# Patient Record
Sex: Male | Born: 1945 | Race: White | Hispanic: No | Marital: Married | Smoking: Never smoker
Health system: Southern US, Community
[De-identification: ages and names within clinical notes are randomized; demographics above are authoritative.]

---

## 2003-02-24 ENCOUNTER — Encounter: Payer: Self-pay | Admitting: Cardiology

## 2003-02-24 ENCOUNTER — Observation Stay (HOSPITAL_COMMUNITY): Admission: AD | Admit: 2003-02-24 | Discharge: 2003-02-24 | Payer: Self-pay | Admitting: Cardiology

## 2003-02-27 ENCOUNTER — Ambulatory Visit (HOSPITAL_COMMUNITY): Admission: RE | Admit: 2003-02-27 | Discharge: 2003-02-28 | Payer: Self-pay | Admitting: Cardiology

## 2005-11-10 ENCOUNTER — Ambulatory Visit: Payer: Self-pay | Admitting: Oncology

## 2009-05-14 ENCOUNTER — Ambulatory Visit (HOSPITAL_COMMUNITY): Admission: RE | Admit: 2009-05-14 | Discharge: 2009-05-14 | Payer: Self-pay | Admitting: Family Medicine

## 2009-05-24 ENCOUNTER — Ambulatory Visit: Payer: Self-pay | Admitting: Emergency Medicine

## 2009-05-24 DIAGNOSIS — J984 Other disorders of lung: Secondary | ICD-10-CM | POA: Insufficient documentation

## 2009-05-24 DIAGNOSIS — I10 Essential (primary) hypertension: Secondary | ICD-10-CM

## 2009-05-24 DIAGNOSIS — K219 Gastro-esophageal reflux disease without esophagitis: Secondary | ICD-10-CM

## 2009-05-24 DIAGNOSIS — I4891 Unspecified atrial fibrillation: Secondary | ICD-10-CM

## 2009-05-24 DIAGNOSIS — L930 Discoid lupus erythematosus: Secondary | ICD-10-CM | POA: Insufficient documentation

## 2009-05-28 ENCOUNTER — Ambulatory Visit (HOSPITAL_COMMUNITY): Admission: RE | Admit: 2009-05-28 | Discharge: 2009-05-28 | Payer: Self-pay | Admitting: Emergency Medicine

## 2009-05-31 ENCOUNTER — Ambulatory Visit: Payer: Self-pay | Admitting: Thoracic Surgery

## 2009-05-31 ENCOUNTER — Ambulatory Visit (HOSPITAL_COMMUNITY): Admission: RE | Admit: 2009-05-31 | Discharge: 2009-05-31 | Payer: Self-pay | Admitting: Thoracic Surgery

## 2009-05-31 ENCOUNTER — Encounter: Payer: Self-pay | Admitting: Thoracic Surgery

## 2009-05-31 ENCOUNTER — Ambulatory Visit: Payer: Self-pay | Admitting: Emergency Medicine

## 2009-06-14 ENCOUNTER — Ambulatory Visit (HOSPITAL_COMMUNITY): Admission: RE | Admit: 2009-06-14 | Discharge: 2009-06-14 | Payer: Self-pay | Admitting: Emergency Medicine

## 2009-06-19 ENCOUNTER — Ambulatory Visit: Payer: Self-pay | Admitting: Emergency Medicine

## 2009-07-02 ENCOUNTER — Telehealth: Payer: Self-pay | Admitting: Emergency Medicine

## 2009-07-03 ENCOUNTER — Encounter: Payer: Self-pay | Admitting: Emergency Medicine

## 2009-07-03 ENCOUNTER — Telehealth (INDEPENDENT_AMBULATORY_CARE_PROVIDER_SITE_OTHER): Payer: Self-pay | Admitting: *Deleted

## 2009-12-05 ENCOUNTER — Ambulatory Visit: Payer: Self-pay | Admitting: Emergency Medicine

## 2009-12-05 LAB — CONVERTED CEMR LAB
BUN: 19 mg/dL (ref 6–23)
CO2: 25 meq/L (ref 19–32)
Calcium: 8.8 mg/dL (ref 8.4–10.5)
Creatinine, Ser: 1.2 mg/dL (ref 0.4–1.5)
Glucose, Bld: 97 mg/dL (ref 70–99)

## 2009-12-12 ENCOUNTER — Ambulatory Visit: Payer: Self-pay | Admitting: Cardiology

## 2010-10-23 IMAGING — CT CT CHEST W/ CM
2 of 4 series · 15 of 36 positions shown, 18 images · IV contrast (Omnipaque 300)
Comparison: 06/15/2009

CLINICAL DATA: Follow-up pulmonary nodule.

CT CHEST WITH CONTRAST
TECHNIQUE: Multidetector CT imaging of the chest was performed
following the standard protocol during bolus administration of
intravenous contrast.
Contrast: 80 ml 3mnipaque-WQQ

[Series 2: chest routine with · axial · 0.73mm/px · z∈[-118,+176]mm · 12 of 71 slices shown, 15 images]
[im 6/71  mediastinal]
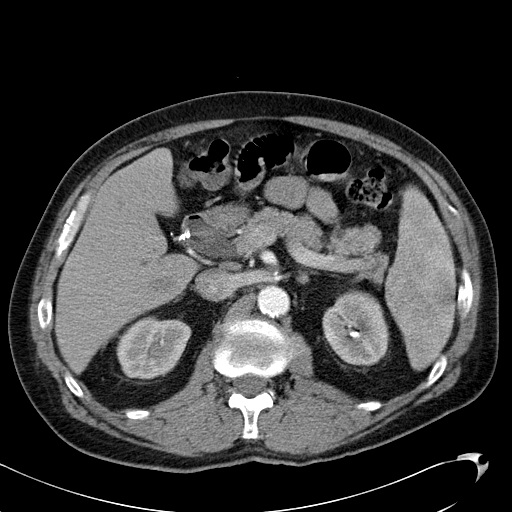
[im 6/71  lung]
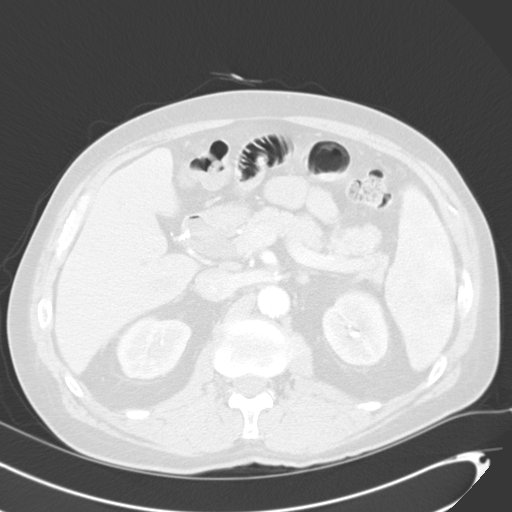
[im 11/71  lung]
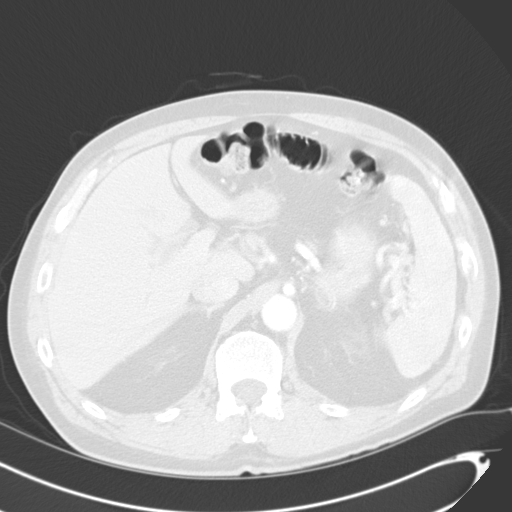
[im 17/71  lung]
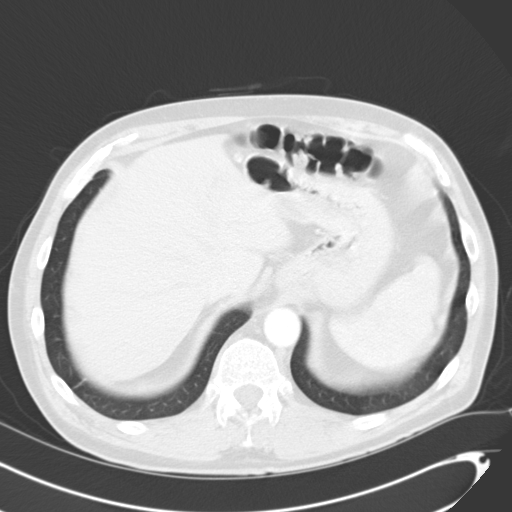
[im 22/71  lung]
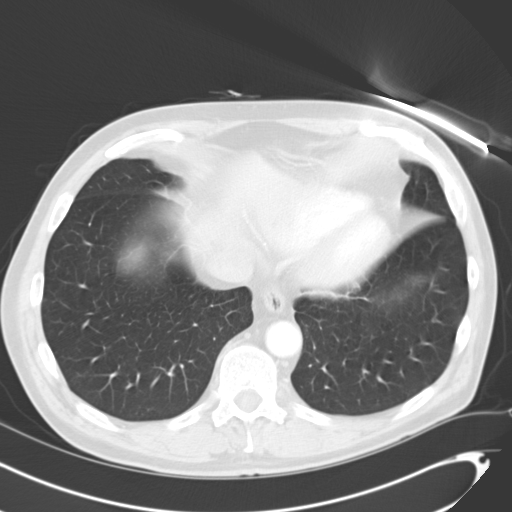
[im 27/71  mediastinal]
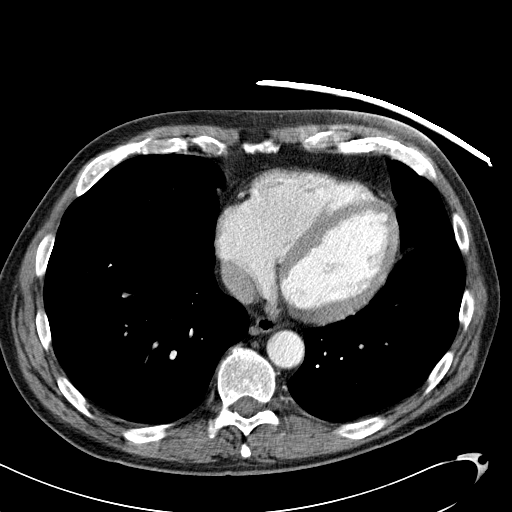
[im 27/71  lung]
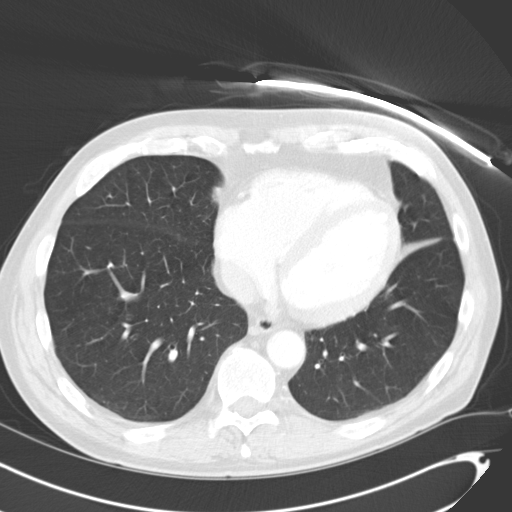
[im 33/71  lung]
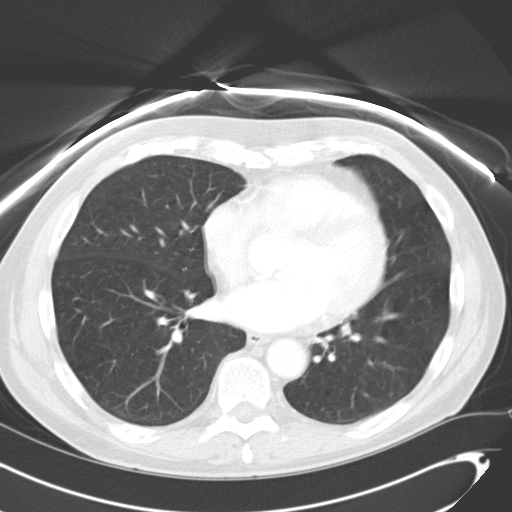
[im 38/71  lung]
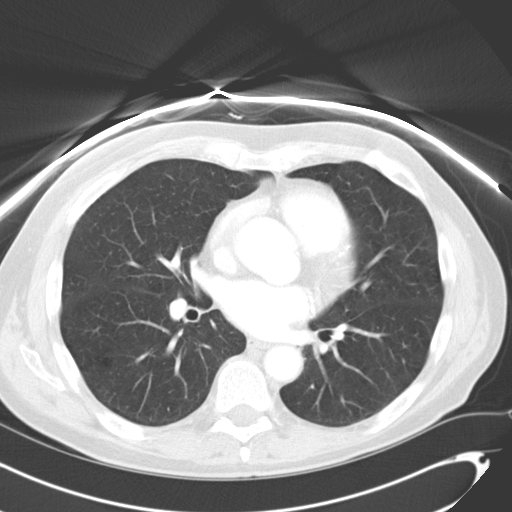
[im 44/71  lung]
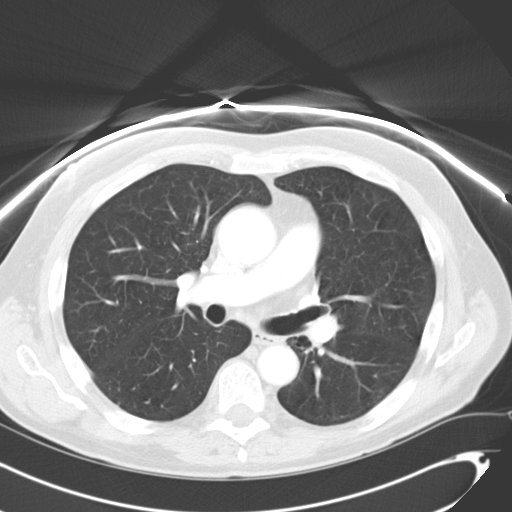
[im 49/71  mediastinal]
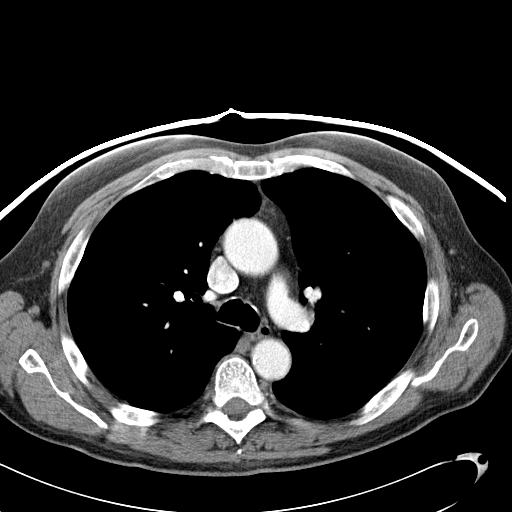
[im 49/71  lung]
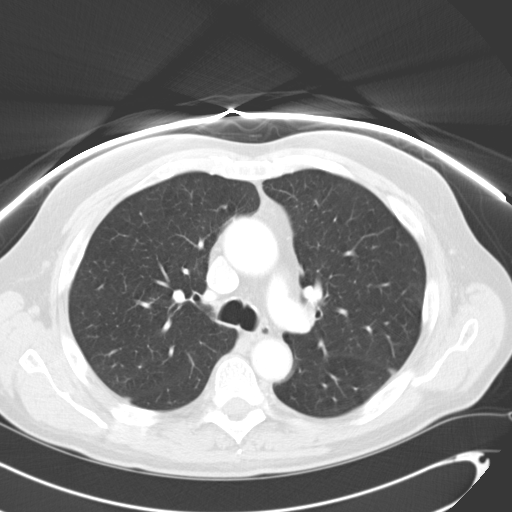
[im 54/71  lung]
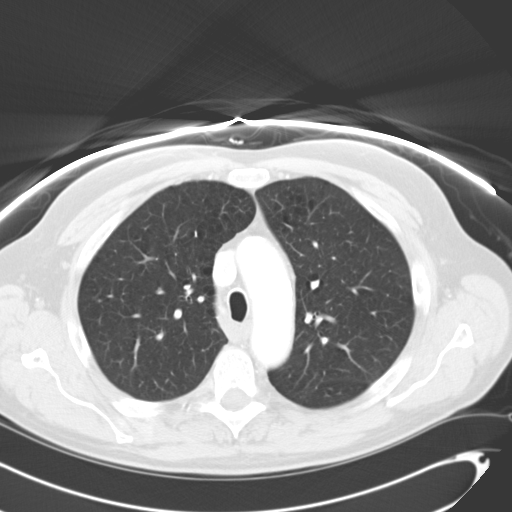
[im 60/71  lung]
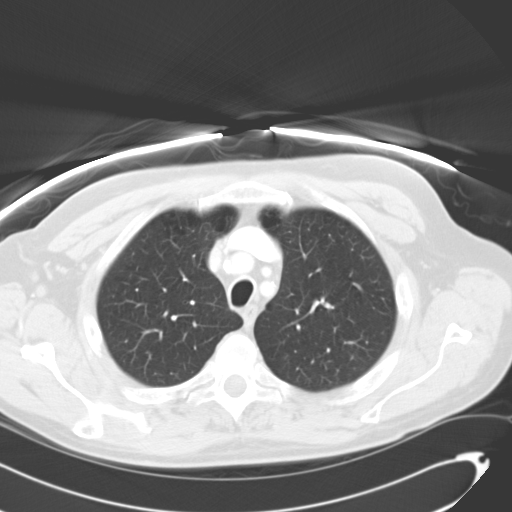
[im 65/71  lung]
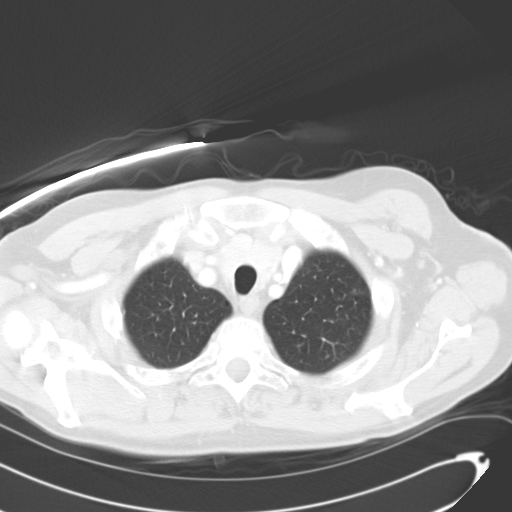

[Series 602: <mpr range> · coronal · 0.73mm/px · 3 of 116 slices shown]
[im 24/116  lung]
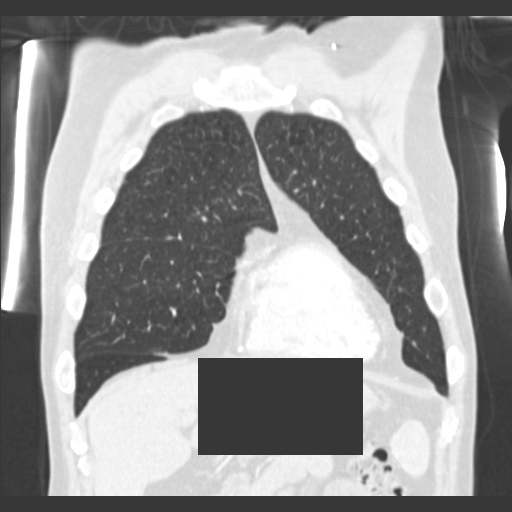
[im 47/116  lung]
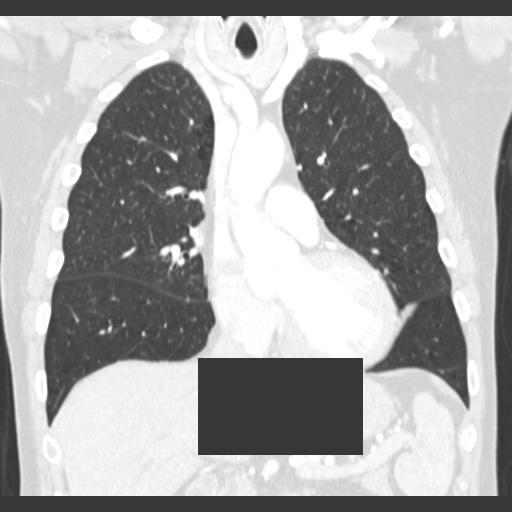
[im 70/116  lung]
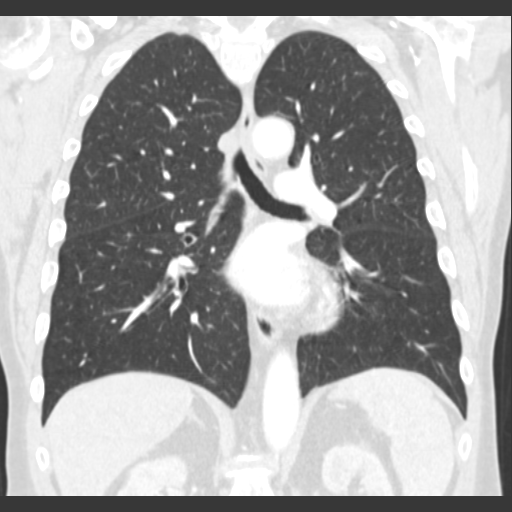

[15 of 36 positions shown; findings below may reference images not displayed]

FINDINGS: Low attenuation lesions in the thyroid measure up to 12
mm on the left.  There is atherosclerotic calcification of the
arterial vasculature, including coronary arteries.  Ascending aorta
measures up to 4.3 cm, stable.  No pathologically enlarged
mediastinal, hilar or axillary lymph nodes.  Heart size normal.  No
pericardial effusion.  Small hiatal hernia.

There has been interval decrease in size of a nodular density in
the left upper lobe (image #8).  It is now linear in configuration.
Emphysema.  Other scattered tiny nodules are stable in size.  No
new nodules.  No pleural fluid.  Airway is unremarkable.

Incidental imaging of the upper abdomen shows low attenuation
lesions in the liver measuring up to 12 mm.  There are stones in
the kidneys bilaterally.  No worrisome lytic or sclerotic lesions.
IMPRESSION: 1.  Near complete resolution of a left upper lobe nodular density,
with probable residual linear scarring.
2.  Mildly dilated ascending aorta.
3.  Extensive coronary artery calcification.
4.  Bilateral nephrolithiasis.

## 2010-10-30 LAB — COMPREHENSIVE METABOLIC PANEL WITH GFR
ALT: 25 U/L (ref 0–53)
AST: 25 U/L (ref 0–37)
Albumin: 3.7 g/dL (ref 3.5–5.2)
Alkaline Phosphatase: 78 U/L (ref 39–117)
BUN: 14 mg/dL (ref 6–23)
CO2: 26 meq/L (ref 19–32)
Calcium: 9 mg/dL (ref 8.4–10.5)
Chloride: 106 meq/L (ref 96–112)
Creatinine, Ser: 1.3 mg/dL (ref 0.4–1.5)
GFR calc non Af Amer: 56 mL/min — ABNORMAL LOW
Glucose, Bld: 87 mg/dL (ref 70–99)
Potassium: 4.2 meq/L (ref 3.5–5.1)
Sodium: 139 meq/L (ref 135–145)
Total Bilirubin: 0.8 mg/dL (ref 0.3–1.2)
Total Protein: 6.1 g/dL (ref 6.0–8.3)

## 2010-10-30 LAB — CBC
HCT: 28.4 % — ABNORMAL LOW (ref 39.0–52.0)
HCT: 32.7 % — ABNORMAL LOW (ref 39.0–52.0)
Hemoglobin: 9.8 g/dL — ABNORMAL LOW (ref 13.0–17.0)
Hemoglobin: 11.2 g/dL — ABNORMAL LOW (ref 13.0–17.0)
MCHC: 34.4 g/dL (ref 30.0–36.0)
MCHC: 34.2 g/dL (ref 30.0–36.0)
MCV: 98.7 fL (ref 78.0–100.0)
MCV: 99.2 fL (ref 78.0–100.0)
Platelets: 160 K/uL (ref 150–400)
Platelets: 155 K/uL (ref 150–400)
RBC: 2.88 MIL/uL — ABNORMAL LOW (ref 4.22–5.81)
RBC: 3.3 MIL/uL — ABNORMAL LOW (ref 4.22–5.81)
RDW: 17 % — ABNORMAL HIGH (ref 11.5–15.5)
RDW: 17.1 % — ABNORMAL HIGH (ref 11.5–15.5)
WBC: 4 K/uL (ref 4.0–10.5)
WBC: 6.2 K/uL (ref 4.0–10.5)

## 2010-10-30 LAB — APTT
aPTT: 28 s (ref 24–37)
aPTT: 29 s (ref 24–37)

## 2010-10-30 LAB — FUNGUS CULTURE W SMEAR

## 2010-10-30 LAB — AFB CULTURE WITH SMEAR (NOT AT ARMC): Acid Fast Smear: NONE SEEN

## 2010-10-30 LAB — PROTIME-INR
INR: 1.16 (ref 0.00–1.49)
Prothrombin Time: 14.7 s (ref 11.6–15.2)

## 2010-10-30 LAB — CULTURE, RESPIRATORY W GRAM STAIN

## 2010-10-31 LAB — GLUCOSE, CAPILLARY: Glucose-Capillary: 79 mg/dL (ref 70–99)

## 2010-12-13 NOTE — H&P (Signed)
Jeffrey, [name]                          ACCOUNT NO.:  [id number]   MEDICAL RECORD NO.:  [id number]                   PATIENT TYPE:  INP   LOCATION:  2027                                 FACILITY:  [facility]   PHYSICIAN:  Learta Codding, M.D.                 DATE OF BIRTH:  [birth year]   DATE OF ADMISSION:  02/24/2003  DATE OF DISCHARGE:                                HISTORY & PHYSICAL   HISTORY OF PRESENT ILLNESS:  A 65 year old gentleman admitted to [facility] with chest pain yesterday.  Jeffrey [name] has no history of known  cardiovascular disease.  He has had borderline hypertension that has not  been treated medically.  He is uncertain regarding lipid status.  He  experienced approximately eight hours of circumferential chest aching on  Wednesday.  This persisted while he was at work.  He left early and went  home and went to sleep after taking some aspirin.  When he awoke, he was  symptom-free and has remained so.  He sought evaluation by his primary care  doctor the next day who admitted him to [facility].  After cardiology  consultation, he was referred here for coronary angiography.  His chest  discomfort was moderately severe.  There was associated diaphoresis and  nausea, but no dyspnea.  He has no history of GI problems.  He has never had  similar symptoms.   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  His only     other medication is hydroxychloroquine.  2. He has had right shoulder surgery in the past for orthopaedic problems.   SOCIAL HISTORY:  Married and lives in [place] with four children.  Smokes  five cigars per day and has done so for the past 10 years.  Occasionally,  uses alcohol.  Works as a Hydrologist.   FAMILY HISTORY:  Father suffered a myocardial infarction at age 71;  he has  had grandparents with myocardial infarction as well.   REVIEW OF SYSTEMS:  Occasional diaphoresis;  lupus symptoms  restricted to  his skin;  frequent eruptation; all other systems negative.   PHYSICAL EXAMINATION:  GENERAL:  A pleasant gentleman in no acute distress.  VITAL SIGNS:  Blood pressure 145/80, heart rate 50 and regular, respirations  16, temperature 98.5.  HEENT:  Anicteric sclerae.  NECK:  Erythema of the malar prominences.  No jugular venous distention.  No  carotid bruits.  ABDOMEN:  Soft and nontender.  No organomegaly.  CARDIAC:  Normal first and second heart sounds;  fourth heart sound present.  LUNGS:  Clear.  EXTREMITIES:  Distal pulses intact;  no edema.  NEUROMUSCULAR:  Symmetric strength and tone.   LABORATORY DATA:  Chest x-ray:  NAD.   EKG:  Sinus bradycardia;  minor nonspecific ST segment abnormality.   Cardiac markers negative x3.  IMPRESSION:  Jeffrey [name] presents with a single episode of chest discomfort  that certainly could represent myocardial ischemia.  EKG's and cardiac  markers have been negative.  He can not spend the weekend in the hospital if  possible.  I believe that this can be accomplished if he has a negative  echocardiogram, negative CT scan, and negative stress test.  We will attempt  to obtain these studies today.  If results are acceptable, he will be  discharged with aspirin, low-dose beta blocker, and sublingual nitroglycerin  to return for outpatient coronary angiography on Monday.  If he experiences  additional chest discomfort, he is instructed to call 911 immediately after  taking sublingual nitroglycerin whether or not that medication achieves  relief of his symptoms.       Calcium Bing, M.D.                     Learta Codding, M.D.    RR/MEDQ  D:  02/24/2003  T:  02/24/2003  Job:  [id number]

## 2010-12-13 NOTE — Cardiovascular Report (Signed)
NAME:  Jeffrey [name], Jeffrey [name]                          ACCOUNT NO.:  [id number]   MEDICAL RECORD NO.:  [id number]                   PATIENT TYPE:  OIB   LOCATION:  2869                                 FACILITY:  [facility]   PHYSICIAN:  Arturo Morton. Riley Kill, M.D.             DATE OF BIRTH:  03/23/1946   DATE OF PROCEDURE:  02/27/2003  DATE OF DISCHARGE:                              CARDIAC CATHETERIZATION   INDICATIONS:  [name] is a patient seen by Aundra Dubin. Revankar, M.D. and  referred and evaluated by Speedway Bing, M.D.  The patient is a 65-year-  old gentleman who has had some chest pain.  He underwent CT scanning which  did not reveal pulmonary emboli.  He also had a stress test in which he had  good exercise tolerance, exercised for nearly 11 minutes and had no chest  pain.  There were some mild ST segment changes.  Subsequently, the patient  was referred for cardiac catheterization.  The current study was done to  assess coronary anatomy.  His echocardiogram apparently was unremarkable as  well.  He was discharged and brought back for cardiac catheterization.   PROCEDURE:  1. Left heart catheterization.  2. Selective coronary arteriography.  3. Selective left ventriculography.   DESCRIPTION OF PROCEDURE:  The procedure was performed from the femoral  artery using 6-French catheters.  He tolerated the procedure well and there  were no complications.   HEMODYNAMIC DATA:  1. Central aorta 148/81, mean 107.  2. Left ventricle 140/11.  3. No gradient on pullback across the aortic valve.   ANGIOGRAPHIC DATA:  1. Ventriculography was performed in the RAO projection.  Overall systolic     function was well preserved and no segmental abnormalities or contraction     were identified.  Ejection fraction appeared to be in excess of 55-60%.  2. The left main coronary artery was free of critical disease.  3. The LAD coursed to the apex.  Between the origins of the first and second     diagonal  branch is an area of slight hypodensity noted in some of the RAO     views, although there did not appear to be more than 30% luminal     reduction.  In the RAO cranial and LAO cranial views the vessel appeared     to be widely patent.  Additional views were obtained also and did not     reveal high grade disease.  The distal LAD appeared to be free of     critical disease.  4. There is a ramus intermedius moderate sized which is free of critical     disease.  5. The AV circumflex demonstrates about 30% eccentric plaquing, however, no     high grade areas of narrowing were noted.  6. The right coronary artery provides a large posterior descending and tiny     posterolateral branch.  The  right coronary artery has a mid 30% area of     stenosis.  It does not appear, likewise, to be high grade.   CONCLUSIONS:  1. Preserved overall left ventricular function.  2. 30% area of irregularity with some hypodensity in the mid left anterior     descending, question healing ruptured plaque versus just mild     irregularity due to the tortuosity at the location site.   DISPOSITION:  We would recommend that the patient stop smoking.  I likely  will add Plavix, but would not keep him on it Darling-term as he is currently  on prednisone for discoid lupus.  We would clearly recommend discontinuation  smoking and the decision regarding possibly lipid reduction.  Follow-up will  be with Aundra Dubin. Revankar, M.D.                                               Arturo Morton. Riley Kill, M.D.    TDS/MEDQ  D:  02/27/2003  T:  02/27/2003  Job:  [id number]   cc:   CV Lab   Aundra Dubin. Revankar, M.D.  [address]  [place]  [place] [address]  Fax: [phone]    Bing, M.D.

## 2010-12-13 NOTE — Discharge Summary (Signed)
NAMESHERYL, TOWELL                          ACCOUNT NO.:  [id number]   MEDICAL RECORD NO.:  [id number]                   PATIENT TYPE:  OIB   LOCATION:  2031                                 FACILITY:  [facility]   PHYSICIAN:  Learta Codding, M.D.                 DATE OF BIRTH:  11/09/1945   DATE OF ADMISSION:  02/27/2003  DATE OF DISCHARGE:  02/28/2003                                 DISCHARGE SUMMARY   DISCHARGE DIAGNOSES:  1. Mild nonobstructive coronary artery disease.  2. Hypertension.  3. Discoid lupus.  4. History of  right shoulder surgery.   Cardiac catheterization this admission by Arturo Morton. Riley Kill, M.D.  EF 55-  60%.  Left main free of critical disease.  Mid-LAD with 30% area of  irregularity with some hypodensity, question healing ruptured plaque versus  just mild irregularity due to tortuosity at the plication site.  Ramus  intermedius moderate-sized, free of critical disease.  AV circumflex 30%  eccentric plaquing.  RCA mid-30% stenosis.   HOSPITAL COURSE:  Please see the admission history and physical from Speed Bing, M.D., on February 24, 2003.  Briefly, this 65 year old gentleman had  been admitted to [facility] with chest pain.  He was referred to  [facility] for further evaluation by Aundra Dubin. Revankar, M.D.  He  underwent CT scanning, which did not reveal pulmonary emboli.  It was  decided that the patient could be discharged and brought back for an  elective catheterization.  He returned to [facility] on February 27, 2003.  He underwent the cardiac catheterization noted above by Dr. Riley Kill.  He tolerated the procedure well and had no immediate complications.  Dr.  Riley Kill recommended that the patient discontinue smoking.  He also  recommended that he remain on aspirin and add Plavix to his medical regimen  for at least two to three months.  On the morning of February 28, 2003, he was  found to be in stable condition by Dr. Myrtis Ser.  He was  ready for discharge to  home and would need follow-up in [place] with Dr. Tomie China.   LABORATORY DATA:  White count 3900, hemoglobin 14.4, hematocrit 41, platelet  count 129,000.  Sodium 141, potassium 3.9, chloride 110, CO2 26, glucose  118, BUN 11, creatinine 0.9, calcium 8.8.  Chest CT from July 30:  Borderline aortic root size measuring 4 cm.  No evidence of pulmonary  embolus or aortic dissection.   DISCHARGE MEDICATIONS:  1. Coated aspirin 325 mg daily.  2. Plavix 75 mg daily.  3. Lopressor 50 mg half-tablet twice daily.  4. Prednisone.  5. Plaquenil 200 mg twice daily.  6. Nitroglycerin as needed for chest pain.   PAIN MANAGEMENT:  Tylenol as needed.  Nitroglycerin as needed for chest  pain.  He is to call 911 for recurrent chest pain.   ACTIVITY:  No  driving, heavy lifting, exertion, work, or sex for three days.   DIET:  Low-salt, low-fat.   WOUND CARE:  The patient is to call our office in [facility] for any groin  swelling, bleeding, or bruising.   SPECIAL INSTRUCTIONS:  He is to have his cholesterol panel and LFTs checked  with Dr. Tomie China at his follow-up appointment.   FOLLOW-UP:  With Dr. Tomie China in the next one or two weeks.  He should call  for an appointment.  He should follow up with family practice as needed.      Tereso Newcomer, P.A.                        Learta Codding, M.D.    SW/MEDQ  D:  02/28/2003  T:  02/28/2003  Job:  [id number]   cc:   Aundra Dubin. Revankar, M.D.  [address]  [place]  [place] [address]  Fax: [phone]

## 2010-12-13 NOTE — Discharge Summary (Signed)
   Jeffrey [name], Jeffrey [name]                          ACCOUNT NO.:  [id number]   MEDICAL RECORD NO.:  [id number]                   PATIENT TYPE:  INP   LOCATION:  2027                                 FACILITY:  [facility]   PHYSICIAN:  Inwood Bing, M.D.               DATE OF BIRTH:  1946-06-03   DATE OF ADMISSION:  02/24/2003  DATE OF DISCHARGE:                                 DISCHARGE SUMMARY   ADDENDUM:   FINAL PROBLEM:  Family history of coronary artery disease.   PLAN:  As noted, the patient had multiple studies today in attempts to rule  out coronary artery disease.  His treadmill test was mildly abnormal.  Plan  is for the patient to be discharged today to return Monday for an outpatient  cardiac catheterization to further evaluate his symptoms.   DISCHARGE MEDICATIONS:  1. Lopressor 25 mg b.i.d.  2. Coated aspirin 325 mg daily.  3. Nitroglycerin p.r.n. chest pain.  4. Prednisone 20 mg daily.  5. Plaquenil 200 mg b.i.d.  6. Tylenol p.r.n. for pain.   DISCHARGE INSTRUCTIONS:  The patient was told to avoid any strenuous  activity until he returns for his catheterization.  He is to be on a low  salt, low fat diet.  He was told not to eat or drink anything after midnight  Sunday night.  He could take his medications Monday a.m. with small sips of  water.  He is to return to [facility] Monday, February 27, 2003 at  9:30 a.m. for cardiac catheterization.      Delton See, P.A. [facility]                  Eleva Bing, M.D.    DR/MEDQ  D:  02/24/2003  T:  02/24/2003  Job:  [id number]

## 2010-12-13 NOTE — Discharge Summary (Signed)
[name], [name]                          ACCOUNT NO.:  [id number]   MEDICAL RECORD NO.:  [id number]                   PATIENT TYPE:  INP   LOCATION:  2027                                 FACILITY:  [facility]   PHYSICIAN:  Corning [name], M.D.               DATE OF BIRTH:  March 28, 1946   DATE OF ADMISSION:  02/24/2003  DATE OF DISCHARGE:  02/24/2003                           DISCHARGE SUMMARY - REFERRING   BRIEF HISTORY:  This is a 65 year old male transferred to [facility] today from [facility] for evaluation of chest pain.  The  patient was seen at [facility] on 02/23/2003, with chest pain.  He was sent to [facility] for further evaluation where he was seen in  cardiology consult by Dr. Tomie [name].  His enzymes were found to be negative x  3.  There was concern that his pain was of an ischemic etiology, and  arrangements were made to transfer the patient to [facility] for  further evaluation.   The patient reports no previous cardiac history.  He awoke Wednesday at  approximately 4 a.m. and noticed chest pain and belching after he was up.  He went on to work.  He had pain most of the day.  He states the pain was  quite severe.  It was like a band around his chest.  He had pain across his  back as well as his sides.  He described the pain as a squeezing pain.  There was diaphoresis and nausea but no shortness of breath, no vomiting.  The patient stated that he had to stand up to get any relief from the pain.  He worked all day that day.  He came home, took aspirin.  He fell asleep,  and he was pain free when he awoke.  He has not had any pain since that  time.  He was transferred here for further evaluation.   PAST MEDICAL HISTORY:  1. Discoid lupus.  2. Questionable history of hypertension.  3. History of right shoulder surgery.   ALLERGIES:  No known drug allergies.   MEDICATIONS PRIOR TO ADMISSION:  Included prednisone and  Plaquenil.   MEDICATIONS ON TRANSFER TO [facility]:  1. Aspirin 325 mg daily.  2. Prednisone 20 mg daily.  3. Lopressor 50 mg b.i.d.  4. Plaquenil 200 mg b.i.d.  5. Heparin IV.   SOCIAL HISTORY:  The patient is married.  He lives in [place].  He has four  children.  He uses alcohol occasionally.  He smokes five cigars per day and  has done so for at least 10 years.  He works as a Hydrologist.   FAMILY HISTORY:  The patient's mother died at age 70 from lung cancer.  His  father died from an MI in his 84s.  His grandparents died from MIs in their  11s.   HOSPITAL COURSE:  As noted, this patient was transferred to [facility] for further evaluation of chest pain.  The plan was for cardiac  catheterization.  On the day of arrival, however, the catheterization  scheduled was full and he was unable to be placed on the schedule.  The  patient was not wanting to wait until Monday for his cardiac  catheterization.  The catheterizations are not routinely done on Saturday  and Sunday.  Decision was made to perform a 2-D echocardiogram, chest CT,  and treadmill exercise test to further evaluate his symptoms prior to  catheterization.  As noted, his enzymes have been negative x 3.  The patient  had an echocardiogram that was within normal limits.  He had a chest CT that  was negative for pulmonary embolus.  He had an exercise stress test showing  mild EKG changes, although the patient was able to exercise to stage IV.  This data was reviewed by Dr. Dietrich [name], and it was felt the patient could  safely be discharged home with plans for an outpatient catheterization to be  performed Monday, August 2.   LABORATORY DATA:  Cardiac enzymes negative x 3 at [facility], and INR  was 1.0, PTT 26.  CBC revealed  hemoglobin 14.6, hematocrit 40.9, WBC 4100,  platelets 151,000.  Chemistries revealed BUN 14, creatinine 1.2, potassium  3.8, chloride 108, total protein 6.1, SGOT low  at 14.   EKG showed sinus bradycardia, rate 52 beats per minute with inferior Q's.   Chest x-ray showed no active disease.   IMPRESSION:  1. Chest pain with abnormal electrocardiogram, negative enzymes, and mild     electrocardiogram changes on exercise tolerance test today.  2. Normal echocardiogram performed July 30 with ejection fraction 55 to 65%.  3. CT scan performed July 30 negative for pulmonary embolus.  4. History of tobacco use.  5. History of discoid lupus.  6. Questionable history of hypertension.  7. Bradycardia secondary to medication.  8. Unknown lipid status.      Jeffrey [name], P.A. [facility]                  Jeffrey [name], M.D.    DR/MEDQ  D:  02/24/2003  T:  02/24/2003  Job:  [id number]   cc:   Aundra Dubin. Revankar, M.D.  [address]  [place]  [place] [address]  Fax: [phone]   [facility]

## 2017-06-26 ENCOUNTER — Ambulatory Visit (INDEPENDENT_AMBULATORY_CARE_PROVIDER_SITE_OTHER): Payer: Medicare Other | Admitting: Sports Medicine

## 2017-06-26 ENCOUNTER — Ambulatory Visit (INDEPENDENT_AMBULATORY_CARE_PROVIDER_SITE_OTHER): Payer: Medicare Other

## 2017-06-26 ENCOUNTER — Encounter: Payer: Self-pay | Admitting: Sports Medicine

## 2017-06-26 VITALS — BP 133/78 | HR 87 | Ht 70.0 in | Wt 190.0 lb

## 2017-06-26 DIAGNOSIS — M779 Enthesopathy, unspecified: Secondary | ICD-10-CM

## 2017-06-26 DIAGNOSIS — M79671 Pain in right foot: Secondary | ICD-10-CM | POA: Diagnosis not present

## 2017-06-26 NOTE — Progress Notes (Signed)
Subjective: Jeffrey [name] is a 71 y.o. male patient who presents to office for evaluation of Right foot pain. Patient complains of progressive pain especially over the last 5-6 days in the right foot at the ball. Ranks pain 7/10 and is now interferring with daily activities when trying to walk better 4/10 when not walking on it. States that it just hurts hard to explain what it feels like. Patient has tried rest, tylenol and blue emu with a little relief in symptoms. Patient denies any other pedal complaints. Denies injury/trip/fall/sprain/any causative factors but does state that he remembers stepping on a acorn and then having pain.   Patient Active Problem List   Diagnosis Date Noted  . ESSENTIAL HYPERTENSION, BENIGN 05/24/2009  . ATRIAL FIBRILLATION 05/24/2009  . PULMONARY NODULE 05/24/2009  . GERD 05/24/2009  . LUPUS ERYTHEMATOSUS, DISCOID 05/24/2009    Current Outpatient Medications on File Prior to Visit  Medication Sig Dispense Refill  . amLODipine (NORVASC) 10 MG tablet TAKE ONE (1) TABLET BY MOUTH ONCE DAILY    . aspirin EC 81 MG tablet Take 81 mg by mouth.    . B Complex Vitamins (VITAMIN B COMPLEX PO) Take by mouth.    . dabigatran (PRADAXA) 150 MG CAPS capsule Take 150 mg by mouth.    . ferrous sulfate 325 (65 FE) MG tablet Take 325 mg by mouth.    . hydrochlorothiazide (HYDRODIURIL) 25 MG tablet TAKE ONE (1) TABLET BY MOUTH ONCE DAILY    . metoprolol succinate (TOPROL-XL) 50 MG 24 hr tablet Take 50 mg by mouth.    . pantoprazole (PROTONIX) 40 MG tablet TAKE ONE (1) TABLET BY MOUTH ONCE DAILY    . potassium chloride SA (K-DUR,KLOR-CON) 20 MEQ tablet TAKE ONE (1) TABLET BY MOUTH ONCE DAILY     No current facility-administered medications on file prior to visit.     No Known Allergies  Objective:  General: Alert and oriented x3 in no acute distress  Dermatology: No open lesions bilateral lower extremities, no webspace macerations, no ecchymosis bilateral, all nails x 10  are well manicured.  Vascular: Dorsalis Pedis and Posterior Tibial pedal pulses palpable, Capillary Fill Time 3 seconds,(+) pedal hair growth bilateral, no edema bilateral lower extremities, Temperature gradient within normal limits.  Neurology: Johney Maine sensation intact via light touch bilateral. (- )Tinels sign bilateral.   Musculoskeletal: Mild tenderness with palpation at ball of right foot most tender sub met 2, No pain with calf compression bilateral. Strength within normal limits in all groups bilateral.   Gait: Antalgic gait  Xrays  Right Foot   Impression: Mild ankle arthritis, Mild forefoot swelling, no other acute findings.   Assessment and Plan: Problem List Items Addressed This Visit    None    Visit Diagnoses    Capsulitis    -  Primary   Relevant Orders   DG Foot Complete Right   Right foot pain           -Complete examination performed -Xrays reviewed -Discussed treatement options for capsulitis After oral consent and aseptic prep, injected a mixture containing 1 ml of 2%  plain lidocaine, 1 ml 0.5% plain marcaine, 0.5 ml of kenalog 10 and 0.5 ml of dexamethasone phosphate into right 2nd MTPJ without complication. Post-injection care discussed with patient.  -Dispensed surgitube compression sleeve to use as instructed  -Applied metatarsal padding to right shoe -Recommend rest, ice, elevation, blue emu and tylenol PRN  -Patient to return to office 3 weeks  or  sooner if condition worsens.  Landis Martins, DPM

## 2017-07-17 ENCOUNTER — Ambulatory Visit (INDEPENDENT_AMBULATORY_CARE_PROVIDER_SITE_OTHER): Payer: Medicare Other | Admitting: Sports Medicine

## 2017-07-17 ENCOUNTER — Encounter: Payer: Self-pay | Admitting: Sports Medicine

## 2017-07-17 DIAGNOSIS — M79671 Pain in right foot: Secondary | ICD-10-CM | POA: Diagnosis not present

## 2017-07-17 DIAGNOSIS — M779 Enthesopathy, unspecified: Secondary | ICD-10-CM

## 2017-07-17 NOTE — Progress Notes (Signed)
Subjective: Jeffrey [name] is a 71 y.o. male patient who returns to office for follow up evaluation of Right foot pain. Patient now ranks pain 1/10 and states that he is feeling 90% better after injection. Patient has been using pads and blue emu with relief in symptoms. Patient denies any other pedal complaints.   Patient Active Problem List   Diagnosis Date Noted  . ESSENTIAL HYPERTENSION, BENIGN 05/24/2009  . ATRIAL FIBRILLATION 05/24/2009  . PULMONARY NODULE 05/24/2009  . GERD 05/24/2009  . LUPUS ERYTHEMATOSUS, DISCOID 05/24/2009   Current Outpatient Medications on File Prior to Visit  Medication Sig Dispense Refill  . amLODipine (NORVASC) 10 MG tablet TAKE ONE (1) TABLET BY MOUTH ONCE DAILY    . aspirin EC 81 MG tablet Take 81 mg by mouth.    . B Complex Vitamins (VITAMIN B COMPLEX PO) Take by mouth.    . dabigatran (PRADAXA) 150 MG CAPS capsule Take 150 mg by mouth.    . ferrous sulfate 325 (65 FE) MG tablet Take 325 mg by mouth.    . hydrochlorothiazide (HYDRODIURIL) 25 MG tablet TAKE ONE (1) TABLET BY MOUTH ONCE DAILY    . metoprolol succinate (TOPROL-XL) 50 MG 24 hr tablet Take 50 mg by mouth.    . pantoprazole (PROTONIX) 40 MG tablet TAKE ONE (1) TABLET BY MOUTH ONCE DAILY    . potassium chloride SA (K-DUR,KLOR-CON) 20 MEQ tablet TAKE ONE (1) TABLET BY MOUTH ONCE DAILY     No current facility-administered medications on file prior to visit.    No Known Allergies  Objective:  General: Alert and oriented x3 in no acute distress  Dermatology: No open lesions bilateral lower extremities, no webspace macerations, no ecchymosis bilateral, all nails x 10 are well manicured.  Vascular: Dorsalis Pedis and Posterior Tibial pedal pulses palpable, Capillary Fill Time 3 seconds,(+) pedal hair growth bilateral, no edema bilateral lower extremities, Temperature gradient within normal limits.  Neurology: Michaell CowingGross sensation intact via light touch bilateral. (- )Tinels sign bilateral.    Musculoskeletal: No tenderness with palpation at ball of right foot, No pain with calf compression bilateral. Strength within normal limits in all groups bilateral   Assessment and Plan: Problem List Items Addressed This Visit    None    Visit Diagnoses    Capsulitis    -  Primary   Right foot pain          -Complete examination performed -Discussed continued plan of care; may continue with rest, ice, tylenol, blue emu as needed  -Dispensed metatarsal pads and recommend good supportive shoes  -Patient to return to office as needed or sooner if condition worsens.  Asencion Islamitorya Shawne Bulow, DPM

## 2017-12-08 ENCOUNTER — Ambulatory Visit (INDEPENDENT_AMBULATORY_CARE_PROVIDER_SITE_OTHER): Payer: Medicare Other | Admitting: Podiatry

## 2017-12-08 ENCOUNTER — Other Ambulatory Visit: Payer: Self-pay | Admitting: Podiatry

## 2017-12-08 ENCOUNTER — Ambulatory Visit (INDEPENDENT_AMBULATORY_CARE_PROVIDER_SITE_OTHER): Payer: Medicare Other

## 2017-12-08 DIAGNOSIS — M329 Systemic lupus erythematosus, unspecified: Secondary | ICD-10-CM

## 2017-12-08 DIAGNOSIS — M79671 Pain in right foot: Secondary | ICD-10-CM

## 2017-12-08 DIAGNOSIS — M779 Enthesopathy, unspecified: Secondary | ICD-10-CM

## 2017-12-08 DIAGNOSIS — R600 Localized edema: Secondary | ICD-10-CM

## 2017-12-08 DIAGNOSIS — M7750 Other enthesopathy of unspecified foot: Secondary | ICD-10-CM

## 2017-12-08 MED ORDER — METHYLPREDNISOLONE 4 MG PO TBPK: ORAL_TABLET | ORAL | 0 refills | Status: AC

## 2017-12-08 NOTE — Progress Notes (Signed)
  Subjective:  Patient ID: Jeffrey [name], male    DOB: 02/07/1946,  MRN: [id number]  Chief Complaint  Patient presents with  . capsulitis    R foot pain and capsulitis flare up x 2 wks; 8/10 sharp pain when walking Tx: none Pt. stated," I've seen Dr. Marylene Land before for the same condition and it started swelling and getting red."   72 y.o. male returns for the above complaint.  Reports flareup of right foot pain for the past 2 weeks.  8 out of 10 sharp pain when walking.  States that he seen Dr. Marylene Land for this issue before.  Objective:  There were no vitals filed for this visit. General AA&O x3. Normal mood and affect.  Vascular Pedal pulses palpable.  Edema right forefoot  Neurologic Epicritic sensation grossly intact.  Dermatologic No open lesions. Skin normal texture and turgor. Discoid rash BUE.  Orthopedic: Pain to palpation R dorsal forefoot.   Assessment & Plan:  Patient was evaluated and treated and all questions answered.  R Foot Capsulitis, ?2/2 Lupus -Unna boot applied for pain and inflammation. Advised to remove in 2 days. -Rx Medrol pack -Should issues persist would consider Rheum referral.  No follow-ups on file.

## 2018-01-05 ENCOUNTER — Ambulatory Visit (INDEPENDENT_AMBULATORY_CARE_PROVIDER_SITE_OTHER): Payer: Medicare Other | Admitting: Podiatry

## 2018-01-05 DIAGNOSIS — M779 Enthesopathy, unspecified: Secondary | ICD-10-CM | POA: Diagnosis not present

## 2018-01-05 DIAGNOSIS — R600 Localized edema: Secondary | ICD-10-CM | POA: Diagnosis not present

## 2018-01-24 NOTE — Progress Notes (Signed)
  Subjective:  Patient ID: Jeffrey [name], male    DOB: [birth year],  MRN: [id number]  Chief Complaint  Patient presents with  . capsulitis    F/U R foot pain Pt. stated," it's doing great no pain at all." Tx: unna boot (helped) , and medrol (completed and helped)   72 y.o. male returns for the above complaint.  States there is doing much better denies pain states that the Unna boot helped in the Medrol did help as well. Objective:  There were no vitals filed for this visit. General AA&O x3. Normal mood and affect.  Vascular Pedal pulses palpable.  Edema right forefoot  Neurologic Epicritic sensation grossly intact.  Dermatologic No open lesions. Skin normal texture and turgor. Discoid rash BUE.  Orthopedic:  No pain to palpation R dorsal forefoot.   Assessment & Plan:  Patient was evaluated and treated and all questions answered.  R Foot Capsulitis, ?2/2 Lupus -Improved.  Will monitor for signs of recurrence.  Follow-up PRN  No follow-ups on file.

## 2018-11-16 DIAGNOSIS — D61818 Other pancytopenia: Secondary | ICD-10-CM

## 2018-11-30 DIAGNOSIS — D61818 Other pancytopenia: Secondary | ICD-10-CM | POA: Diagnosis not present

## 2018-12-02 DIAGNOSIS — D61818 Other pancytopenia: Secondary | ICD-10-CM

## 2019-04-05 DIAGNOSIS — D61818 Other pancytopenia: Secondary | ICD-10-CM | POA: Diagnosis not present

## 2019-04-05 DIAGNOSIS — D649 Anemia, unspecified: Secondary | ICD-10-CM

## 2019-08-31 DIAGNOSIS — U071 COVID-19: Secondary | ICD-10-CM

## 2019-08-31 DIAGNOSIS — I4891 Unspecified atrial fibrillation: Secondary | ICD-10-CM | POA: Diagnosis not present

## 2019-08-31 DIAGNOSIS — I10 Essential (primary) hypertension: Secondary | ICD-10-CM

## 2019-09-01 DIAGNOSIS — U071 COVID-19: Secondary | ICD-10-CM | POA: Diagnosis not present

## 2019-09-01 DIAGNOSIS — I10 Essential (primary) hypertension: Secondary | ICD-10-CM | POA: Diagnosis not present

## 2019-09-01 DIAGNOSIS — I4891 Unspecified atrial fibrillation: Secondary | ICD-10-CM | POA: Diagnosis not present

## 2019-09-02 DIAGNOSIS — I4891 Unspecified atrial fibrillation: Secondary | ICD-10-CM | POA: Diagnosis not present

## 2019-09-02 DIAGNOSIS — U071 COVID-19: Secondary | ICD-10-CM | POA: Diagnosis not present

## 2019-09-02 DIAGNOSIS — I10 Essential (primary) hypertension: Secondary | ICD-10-CM | POA: Diagnosis not present

## 2019-09-03 DIAGNOSIS — I10 Essential (primary) hypertension: Secondary | ICD-10-CM | POA: Diagnosis not present

## 2019-09-03 DIAGNOSIS — I4891 Unspecified atrial fibrillation: Secondary | ICD-10-CM | POA: Diagnosis not present

## 2019-09-03 DIAGNOSIS — U071 COVID-19: Secondary | ICD-10-CM | POA: Diagnosis not present

## 2019-09-04 DIAGNOSIS — U071 COVID-19: Secondary | ICD-10-CM | POA: Diagnosis not present

## 2019-09-04 DIAGNOSIS — I10 Essential (primary) hypertension: Secondary | ICD-10-CM | POA: Diagnosis not present

## 2019-09-04 DIAGNOSIS — I4891 Unspecified atrial fibrillation: Secondary | ICD-10-CM | POA: Diagnosis not present

## 2019-09-05 DIAGNOSIS — U071 COVID-19: Secondary | ICD-10-CM | POA: Diagnosis not present

## 2019-09-05 DIAGNOSIS — I10 Essential (primary) hypertension: Secondary | ICD-10-CM | POA: Diagnosis not present

## 2019-09-05 DIAGNOSIS — I4891 Unspecified atrial fibrillation: Secondary | ICD-10-CM | POA: Diagnosis not present

## 2019-09-06 DIAGNOSIS — U071 COVID-19: Secondary | ICD-10-CM | POA: Diagnosis not present

## 2019-09-06 DIAGNOSIS — I10 Essential (primary) hypertension: Secondary | ICD-10-CM | POA: Diagnosis not present

## 2019-09-06 DIAGNOSIS — I4891 Unspecified atrial fibrillation: Secondary | ICD-10-CM | POA: Diagnosis not present

## 2019-10-06 DIAGNOSIS — D649 Anemia, unspecified: Secondary | ICD-10-CM | POA: Diagnosis not present

## 2019-12-07 DIAGNOSIS — D649 Anemia, unspecified: Secondary | ICD-10-CM

## 2019-12-28 DIAGNOSIS — I10 Essential (primary) hypertension: Secondary | ICD-10-CM | POA: Diagnosis not present

## 2019-12-28 DIAGNOSIS — I4891 Unspecified atrial fibrillation: Secondary | ICD-10-CM

## 2019-12-28 DIAGNOSIS — J189 Pneumonia, unspecified organism: Secondary | ICD-10-CM | POA: Diagnosis not present

## 2019-12-29 DIAGNOSIS — J189 Pneumonia, unspecified organism: Secondary | ICD-10-CM | POA: Diagnosis not present

## 2019-12-29 DIAGNOSIS — I10 Essential (primary) hypertension: Secondary | ICD-10-CM | POA: Diagnosis not present

## 2019-12-29 DIAGNOSIS — I4891 Unspecified atrial fibrillation: Secondary | ICD-10-CM | POA: Diagnosis not present

## 2019-12-30 DIAGNOSIS — I4891 Unspecified atrial fibrillation: Secondary | ICD-10-CM | POA: Diagnosis not present

## 2019-12-30 DIAGNOSIS — I10 Essential (primary) hypertension: Secondary | ICD-10-CM | POA: Diagnosis not present

## 2019-12-30 DIAGNOSIS — J189 Pneumonia, unspecified organism: Secondary | ICD-10-CM | POA: Diagnosis not present

## 2019-12-31 DIAGNOSIS — J189 Pneumonia, unspecified organism: Secondary | ICD-10-CM | POA: Diagnosis not present

## 2019-12-31 DIAGNOSIS — I10 Essential (primary) hypertension: Secondary | ICD-10-CM | POA: Diagnosis not present

## 2019-12-31 DIAGNOSIS — I4891 Unspecified atrial fibrillation: Secondary | ICD-10-CM | POA: Diagnosis not present

## 2020-05-28 ENCOUNTER — Other Ambulatory Visit: Payer: Self-pay | Admitting: Hematology and Oncology

## 2020-05-28 DIAGNOSIS — D649 Anemia, unspecified: Secondary | ICD-10-CM

## 2020-05-28 DIAGNOSIS — D529 Folate deficiency anemia, unspecified: Secondary | ICD-10-CM

## 2020-06-07 ENCOUNTER — Other Ambulatory Visit: Payer: Self-pay | Admitting: Oncology

## 2020-06-07 DIAGNOSIS — D649 Anemia, unspecified: Secondary | ICD-10-CM

## 2020-06-07 DIAGNOSIS — D539 Nutritional anemia, unspecified: Secondary | ICD-10-CM

## 2020-06-07 NOTE — Progress Notes (Deleted)
  [facility]  [address] [place],  [place]  [address] [phone]  Clinic Day:  06/07/2020  Referring physician: Raynelle Jan., MD   HISTORY OF PRESENT ILLNESS:  The patient is a 74 y.o. male with anemia.  In the past, this patient also had borderline pancytopenia.  However, as his counts have not been severely low, his anemia has been followed conservatively.  He comes in today for routine follow-up.  Since his last visit, the patient has been doing fairly well.  He denies having increased fatigue or any overt forms of blood loss which concern him for progressive anemia.    PHYSICAL EXAM:  There were no vitals taken for this visit. Wt Readings from Last 3 Encounters:  06/26/17 190 lb (86.2 kg)   There is no height or weight on file to calculate BMI. Performance status (ECOG): {CHL ONC [id number] Physical Exam Constitutional:      Appearance: Normal appearance. He is not ill-appearing.  HENT:     Mouth/Throat:     Mouth: Mucous membranes are moist.     Pharynx: Oropharynx is clear. No oropharyngeal exudate or posterior oropharyngeal erythema.  Cardiovascular:     Rate and Rhythm: Normal rate and regular rhythm.     Heart sounds: No murmur heard.  No friction rub. No gallop.   Pulmonary:     Effort: Pulmonary effort is normal. No respiratory distress.     Breath sounds: Normal breath sounds. No wheezing, rhonchi or rales.  Abdominal:     General: Bowel sounds are normal. There is no distension.     Palpations: Abdomen is soft. There is no mass.     Tenderness: There is no abdominal tenderness.  Musculoskeletal:        General: No swelling.     Right lower leg: No edema.     Left lower leg: No edema.  Lymphadenopathy:     Cervical: No cervical adenopathy.     Upper Body:     Right upper body: No supraclavicular or axillary adenopathy.     Left upper body: No supraclavicular or axillary adenopathy.     Lower Body: No right  inguinal adenopathy. No left inguinal adenopathy.  Skin:    General: Skin is warm.     Coloration: Skin is not jaundiced.     Findings: No lesion or rash.  Neurological:     General: No focal deficit present.     Mental Status: He is alert and oriented to person, place, and time. Mental status is at baseline.     Cranial Nerves: Cranial nerves are intact.  Psychiatric:        Mood and Affect: Mood normal.        Behavior: Behavior normal.        Thought Content: Thought content normal.     LABS:    ASSESSMENT & PLAN:   Assessment/Plan:  A 74 y.o. male with anemia.  When evaluating his labs today,   The patient understands all the plans discussed today and is in agreement with them.      Mazikeen Hehn Kirby Funk, MD

## 2020-06-08 ENCOUNTER — Inpatient Hospital Stay: Payer: Medicare Other | Admitting: Oncology

## 2020-06-08 ENCOUNTER — Inpatient Hospital Stay: Payer: Medicare Other | Attending: Family Medicine

## 2020-10-08 ENCOUNTER — Encounter: Payer: Self-pay | Admitting: Gastroenterology

## 2020-10-15 ENCOUNTER — Telehealth: Payer: Self-pay | Admitting: Gastroenterology

## 2020-10-15 NOTE — Telephone Encounter (Signed)
Pt due for recall colon but pts wife states insurance will not cover the procedure unless it is medically necessary due to his age. Dr. Chales Abrahams notified.

## 2020-10-17 NOTE — Telephone Encounter (Signed)
Thanks for letting me know Agree with above. Colonoscopy only if he has any problems d/t advanced age. RG

## 2022-02-02 ENCOUNTER — Other Ambulatory Visit: Payer: Self-pay | Admitting: Oncology

## 2022-02-02 DIAGNOSIS — D649 Anemia, unspecified: Secondary | ICD-10-CM

## 2022-02-02 NOTE — Progress Notes (Unsigned)
  [facility]  [address] [place],  [place]  [address] [phone]  Clinic Day:  02/02/2022  Referring physician: Raynelle Jan., MD   HISTORY OF PRESENT ILLNESS:  The patient is a 76 y.o. male who I have seen in the past for anemia/borderline pancytopenia.   PHYSICAL EXAM:  There were no vitals taken for this visit. Wt Readings from Last 3 Encounters:  06/26/17 190 lb (86.2 kg)   There is no height or weight on file to calculate BMI. Performance status (ECOG): {CHL ONC [id number] Physical Exam  LABS:      Latest Ref Rng & Units 06/14/2009    9:24 AM 05/30/2009    2:38 PM  CBC  WBC 4.0 - 10.5 K/uL 4.0  6.2   Hemoglobin 13.0 - 17.0 g/dL 9.8  91.4   Hematocrit 39.0 - 52.0 % 28.4  32.7   Platelets 150 - 400 K/uL 160  155       Latest Ref Rng & Units 12/05/2009    9:16 AM 05/30/2009    2:38 PM  CMP  Glucose 70 - 99 mg/dL 97  87   BUN 6 - 23 mg/dL 19  14   Creatinine 0.4 - 1.5 mg/dL 1.2  7.82   Sodium 956 - 145 meq/L 142  139   Potassium 3.5 - 5.1 meq/L 4.5  4.2   Chloride 96 - 112 meq/L 111  106   CO2 19 - 32 meq/L 25  26   Calcium 8.4 - 10.5 mg/dL 8.8  9.0   Total Protein 6.0 - 8.3 g/dL  6.1   Total Bilirubin 0.3 - 1.2 mg/dL  0.8   Alkaline Phos 39 - 117 U/L  78   AST 0 - 37 U/L  25   ALT 0 - 53 U/L  25      No results found for: "CEA1", "CEA" / No results found for: "CEA1", "CEA" No results found for: "PSA1" No results found for: "OZH086" No results found for: "CAN125"  No results found for: "TOTALPROTELP", "ALBUMINELP", "A1GS", "A2GS", "BETS", "BETA2SER", "GAMS", "MSPIKE", "SPEI" No results found for: "TIBC", "FERRITIN", "IRONPCTSAT" No results found for: "LDH"  No results found for: "AFPTUMOR", "TOTALPROTELP", "ALBUMINELP", "A1GS", "A2GS", "BETS", "BETA2SER", "GAMS", "MSPIKE", "SPEI", "LDH", "CEA1", "CEA", "PSA1", "IGASERUM", "IGGSERUM", "IGMSERUM", "THGAB", "THYROGLB"  Review Flowsheet        No data to  display           STUDIES:  No results found.    ASSESSMENT & PLAN:   Assessment/Plan:  A 76 y.o. male with *** .The patient understands all the plans discussed today and is in agreement with them.      Makinzi Prieur Kirby Funk, MD

## 2022-02-03 ENCOUNTER — Other Ambulatory Visit: Payer: Self-pay | Admitting: Oncology

## 2022-02-03 ENCOUNTER — Inpatient Hospital Stay: Payer: Medicare Other

## 2022-02-03 ENCOUNTER — Inpatient Hospital Stay: Payer: Medicare Other | Attending: Oncology | Admitting: Oncology

## 2022-02-03 DIAGNOSIS — D649 Anemia, unspecified: Secondary | ICD-10-CM

## 2022-02-03 DIAGNOSIS — I10 Essential (primary) hypertension: Secondary | ICD-10-CM

## 2022-02-03 LAB — VITAMIN B12: Vitamin B-12: 1074 pg/mL — ABNORMAL HIGH (ref 180–914)

## 2022-02-03 LAB — CBC AND DIFFERENTIAL
HCT: 31 — AB (ref 41–53)
Hemoglobin: 10.3 — AB (ref 13.5–17.5)
Neutrophils Absolute: 3.14
Platelets: 152 10*3/uL (ref 150–400)
WBC: 4.9

## 2022-02-03 LAB — CBC: RBC: 3.05 — AB (ref 3.87–5.11)

## 2022-02-03 LAB — FOLATE: Folate: 23.8 ng/mL (ref >5.9)

## 2022-07-31 ENCOUNTER — Telehealth: Payer: Self-pay | Admitting: Oncology

## 2022-07-31 NOTE — Telephone Encounter (Signed)
07/31/22 Patients wife called and cancelled appts.Did not want to reschedule

## 2022-08-06 ENCOUNTER — Other Ambulatory Visit: Payer: Medicare Other

## 2022-08-06 ENCOUNTER — Ambulatory Visit: Payer: Medicare Other | Admitting: Oncology
